# Patient Record
Sex: Male | Born: 1950 | Race: Black or African American | Hispanic: No | Marital: Married | Smoking: Former smoker
Health system: Southern US, Community
[De-identification: ages and names within clinical notes are randomized; demographics above are authoritative.]

## PROBLEM LIST (undated history)

## (undated) DIAGNOSIS — E119 Type 2 diabetes mellitus without complications: Secondary | ICD-10-CM

## (undated) DIAGNOSIS — E7800 Pure hypercholesterolemia, unspecified: Secondary | ICD-10-CM

## (undated) HISTORY — PX: APPENDECTOMY: SHX54

---

## 2019-05-18 ENCOUNTER — Encounter (HOSPITAL_COMMUNITY): Payer: Self-pay

## 2019-05-18 ENCOUNTER — Emergency Department (HOSPITAL_COMMUNITY): Payer: Medicare Other

## 2019-05-18 ENCOUNTER — Other Ambulatory Visit: Payer: Self-pay

## 2019-05-18 ENCOUNTER — Emergency Department (HOSPITAL_COMMUNITY)
Admission: EM | Admit: 2019-05-18 | Discharge: 2019-05-18 | Disposition: A | Discharge status: Home / Self Care | Payer: Medicare Other | Attending: Emergency Medicine | Admitting: Emergency Medicine

## 2019-05-18 DIAGNOSIS — R079 Chest pain, unspecified: Secondary | ICD-10-CM | POA: Diagnosis present

## 2019-05-18 DIAGNOSIS — E119 Type 2 diabetes mellitus without complications: Secondary | ICD-10-CM | POA: Diagnosis not present

## 2019-05-18 DIAGNOSIS — Z87891 Personal history of nicotine dependence: Secondary | ICD-10-CM | POA: Diagnosis not present

## 2019-05-18 DIAGNOSIS — K2100 Gastro-esophageal reflux disease with esophagitis, without bleeding: Secondary | ICD-10-CM | POA: Insufficient documentation

## 2019-05-18 HISTORY — DX: Type 2 diabetes mellitus without complications: E11.9

## 2019-05-18 HISTORY — DX: Pure hypercholesterolemia, unspecified: E78.00

## 2019-05-18 LAB — BASIC METABOLIC PANEL
Anion gap: 9 (ref 5–15)
BUN: 9 mg/dL (ref 8–23)
CO2: 24 mmol/L (ref 22–32)
Calcium: 9.3 mg/dL (ref 8.9–10.3)
Chloride: 103 mmol/L (ref 98–111)
Creatinine, Ser: 1.02 mg/dL (ref 0.61–1.24)
GFR calc Af Amer: >60 mL/min (ref >60)
GFR calc non Af Amer: >60 mL/min (ref >60)
Glucose, Bld: 94 mg/dL (ref 70–99)
Potassium: 3.9 mmol/L (ref 3.5–5.1)
Sodium: 136 mmol/L (ref 135–145)

## 2019-05-18 LAB — CBC
HCT: 38.2 % — ABNORMAL LOW (ref 39.0–52.0)
Hemoglobin: 13 g/dL (ref 13.0–17.0)
MCH: 30.8 pg (ref 26.0–34.0)
MCHC: 34 g/dL (ref 30.0–36.0)
MCV: 90.5 fL (ref 80.0–100.0)
Platelets: 268 10*3/uL (ref 150–400)
RBC: 4.22 MIL/uL (ref 4.22–5.81)
RDW: 12.9 % (ref 11.5–15.5)
WBC: 5.1 10*3/uL (ref 4.0–10.5)
nRBC: 0 % (ref 0.0–0.2)

## 2019-05-18 LAB — TROPONIN I (HIGH SENSITIVITY): Troponin I (High Sensitivity): 4 ng/L (ref <18)

## 2019-05-18 MED ORDER — SODIUM CHLORIDE 0.9% FLUSH: 3.0000 mL | Freq: Once | INTRAVENOUS | Status: DC

## 2019-05-18 NOTE — ED Triage Notes (Signed)
Pt presents w/Left side chest discomfort starting last night

## 2019-05-18 NOTE — ED Notes (Signed)
Pt states he is going to leave and go see his primary MD. RN advised against leaving.

## 2019-05-18 NOTE — ED Provider Notes (Signed)
[facility] DEPART[facility] Provider Note   CSN: [id number] Arrival date & time: 05/18/19  1003     History   Chief Complaint Chief Complaint  Patient presents with  . Chest Pain    HPI Jeffrey Sparks is a 68 y.o. male.     HPI   Patient presenting for evaluation of chest discomfort with known history of reflux esophagitis, and diabetes.  He is here for evaluation of episode of chest discomfort occurred last night when he was "slouching on the couch."  He describes having sharp pressure-like discomfort in the left anterior chest that lasted about an hour and a half improved after he took Pepcid and drink some water and belched.  He has had episodes of heartburn previously, and 2 months ago had upper endoscopy which showed erosive esophagitis, at which time he was started on pantoprazole.  No prior cardiac history.  He denies shortness of breath, recurrent chest discomfort, headache, neck pain or back pain.  He is taking his usual medications.  There are no other known modifying factors.  Past Medical History:  Diagnosis Date  . Diabetes mellitus without complication ([facility])   . High cholesterol     There are no active problems to display for this patient.   Past Surgical History:  Procedure Laterality Date  . APPENDECTOMY          Home Medications    Prior to Admission medications   Not on File    Family History History reviewed. No pertinent family history.  Social History Social History   Tobacco Use  . Smoking status: Former Research scientist (life sciences)  . Smokeless tobacco: Never Used  Substance Use Topics  . Alcohol use: Yes    Comment: social   . Drug use: Never     Allergies   Patient has no known allergies.   Review of Systems Review of Systems  All other systems reviewed and are negative.    Physical Exam Updated Vital Signs BP 117/80   Pulse 67   Temp 98.6 F (37 C) (Oral)   Resp 13   Ht 5\' 9"  (1.753 m)   Wt 88.5 kg   SpO2 100%    BMI 28.80 kg/m   Physical Exam Vitals signs and nursing note reviewed.  Constitutional:      Appearance: He is well-developed.  HENT:     Head: Normocephalic and atraumatic.     Right Ear: External ear normal.     Left Ear: External ear normal.  Eyes:     Conjunctiva/sclera: Conjunctivae normal.     Pupils: Pupils are equal, round, and reactive to light.  Neck:     Musculoskeletal: Normal range of motion and neck supple.     Trachea: Phonation normal.  Cardiovascular:     Rate and Rhythm: Normal rate and regular rhythm.     Heart sounds: Normal heart sounds. No murmur. No friction rub.  Pulmonary:     Effort: Pulmonary effort is normal.     Breath sounds: Normal breath sounds.  Abdominal:     Palpations: Abdomen is soft.     Tenderness: There is no abdominal tenderness.  Musculoskeletal: Normal range of motion.        General: No swelling or tenderness.     Comments:    Skin:    General: Skin is warm and dry.  Neurological:     Mental Status: He is alert and oriented to person, place, and time.     Cranial  Nerves: No cranial nerve deficit.     Sensory: No sensory deficit.     Motor: No abnormal muscle tone.     Coordination: Coordination normal.  Psychiatric:        Mood and Affect: Mood normal.        Behavior: Behavior normal.        Thought Content: Thought content normal.        Judgment: Judgment normal.      ED Treatments / Results  Labs (all labs ordered are listed, but only abnormal results are displayed) Labs Reviewed  CBC - Abnormal; Notable for the following components:      Result Value   HCT 38.2 (*)    All other components within normal limits  BASIC METABOLIC PANEL  TROPONIN I (HIGH SENSITIVITY)  TROPONIN I (HIGH SENSITIVITY)    EKG EKG Interpretation  Date/Time:  Tuesday May 18 2019 10:40:55 EST Ventricular Rate:  66 PR Interval:  176 QRS Duration: 84 QT Interval:  392 QTC Calculation: 410 R Axis:   83 Text Interpretation:  Normal sinus rhythm Normal ECG No old tracing to compare Confirmed by Mancel BaleWentz, Kanesha Cadle [phone]([id number]) on 05/18/2019 4:34:07 PM   Radiology Dg Chest 2 View  Result Date: 05/18/2019 CLINICAL DATA:  Onset left chest discomfort last night. No known injury. EXAM: CHEST - 2 VIEW COMPARISON:  None. FINDINGS: The lungs are clear. Heart size is normal. No pneumothorax or pleural fluid. No acute or focal bony abnormality. IMPRESSION: Negative chest. Electronically Signed   By: Drusilla Kannerhomas  Dalessio M.D.   On: 05/18/2019 11:02    Procedures Procedures (including critical care time)  Medications Ordered in ED Medications  sodium chloride flush (NS) 0.9 % injection 3 mL (3 mLs Intravenous Not Given 05/18/19 1628)     Initial Impression / Assessment and Plan / ED Course  I have reviewed the triage vital signs and the nursing notes.  Pertinent labs & imaging results that were available during my care of the patient were reviewed by me and considered in my medical decision making (see chart for details).  Clinical Course as of May 17 1714  Tue May 18, 2019  1636 Troponin I (High Sensitivity) [EW]  [phone] Normal  Troponin I (High Sensitivity) [EW]  1636 Normal  CBC(!) [EW]  1636 Normal  Basic metabolic panel [EW]  1643 No infiltrate or CHF, images interpreted by me  DG Chest 2 View [EW]  1708 Normal  Troponin I (High Sensitivity) [EW]    Clinical Course User Index [EW] Mancel BaleWentz, Oleg Oleson, MD        Patient Vitals for the past 24 hrs:  BP Temp Temp src Pulse Resp SpO2 Height Weight  05/18/19 1700 117/80 - - 67 13 100 % - -  05/18/19 1645 136/77 - - 62 17 99 % - -  05/18/19 1630 (!) 143/79 - - 65 16 100 % - -  05/18/19 1627 - - - - - - 5\' 9"  (1.753 m) 88.5 kg  05/18/19 1625 (!) 133/92 - - (!) 57 18 100 % - -  05/18/19 1036 (!) 126/96 98.6 F (37 C) Oral 61 18 98 % - -    4:39 PM Reevaluation with update and discussion. After initial assessment and treatment, an updated evaluation reveals he is  comfortable has no further complaints, findings discussed and questions answered. Mancel BaleElliott Alaine Loughney   Medical Decision Making: Chest pain most likely related to reflux disease with known esophagitis.  Doubt ACS, PE or pneumonia.  No additional evaluation required.  Stable for discharge.  CRITICAL CARE-no Performed by: Mancel Bale  Nursing Notes Reviewed/ Care Coordinated Applicable Imaging Reviewed Interpretation of Laboratory Data incorporated into ED treatment  The patient appears reasonably screened and/or stabilized for discharge and I doubt any other medical condition or other [facility] requiring further screening, evaluation, or treatment in the ED at this time prior to discharge.  Plan: Home Medications-continue usual, supplement with antacids as needed; Home Treatments-GERD diet; return here if the recommended treatment, does not improve the symptoms; Recommended follow up-PCP and GI.   Final Clinical Impressions(s) / ED Diagnoses   Final diagnoses:  Nonspecific chest pain  Gastroesophageal reflux disease with esophagitis, unspecified whether hemorrhage    ED Discharge Orders    None       Mancel Bale, MD 05/18/19 1715

## 2019-05-18 NOTE — Discharge Instructions (Addendum)
Testing today did not show any serious problems with your heart, or lungs.  Most likely, your pain was related to reflux, aggravating your esophagitis.  Treatment for additional episodes like this include antacids such as Mylanta or Tums.  Continue taking your pantoprazole and follow the diet recommended by your gastroenterologist.  Make sure you are getting plenty of rest, and avoid slouching which can aggravate reflux.  Return here if needed for problems.

## 2020-01-01 IMAGING — CR DG CHEST 2V
2 series · 2 of 2 positions shown · non-contrast
Comparison: None.

CLINICAL DATA: Onset left chest discomfort last night. No known
injury.

EXAM:
CHEST - 2 VIEW

[chest pa]
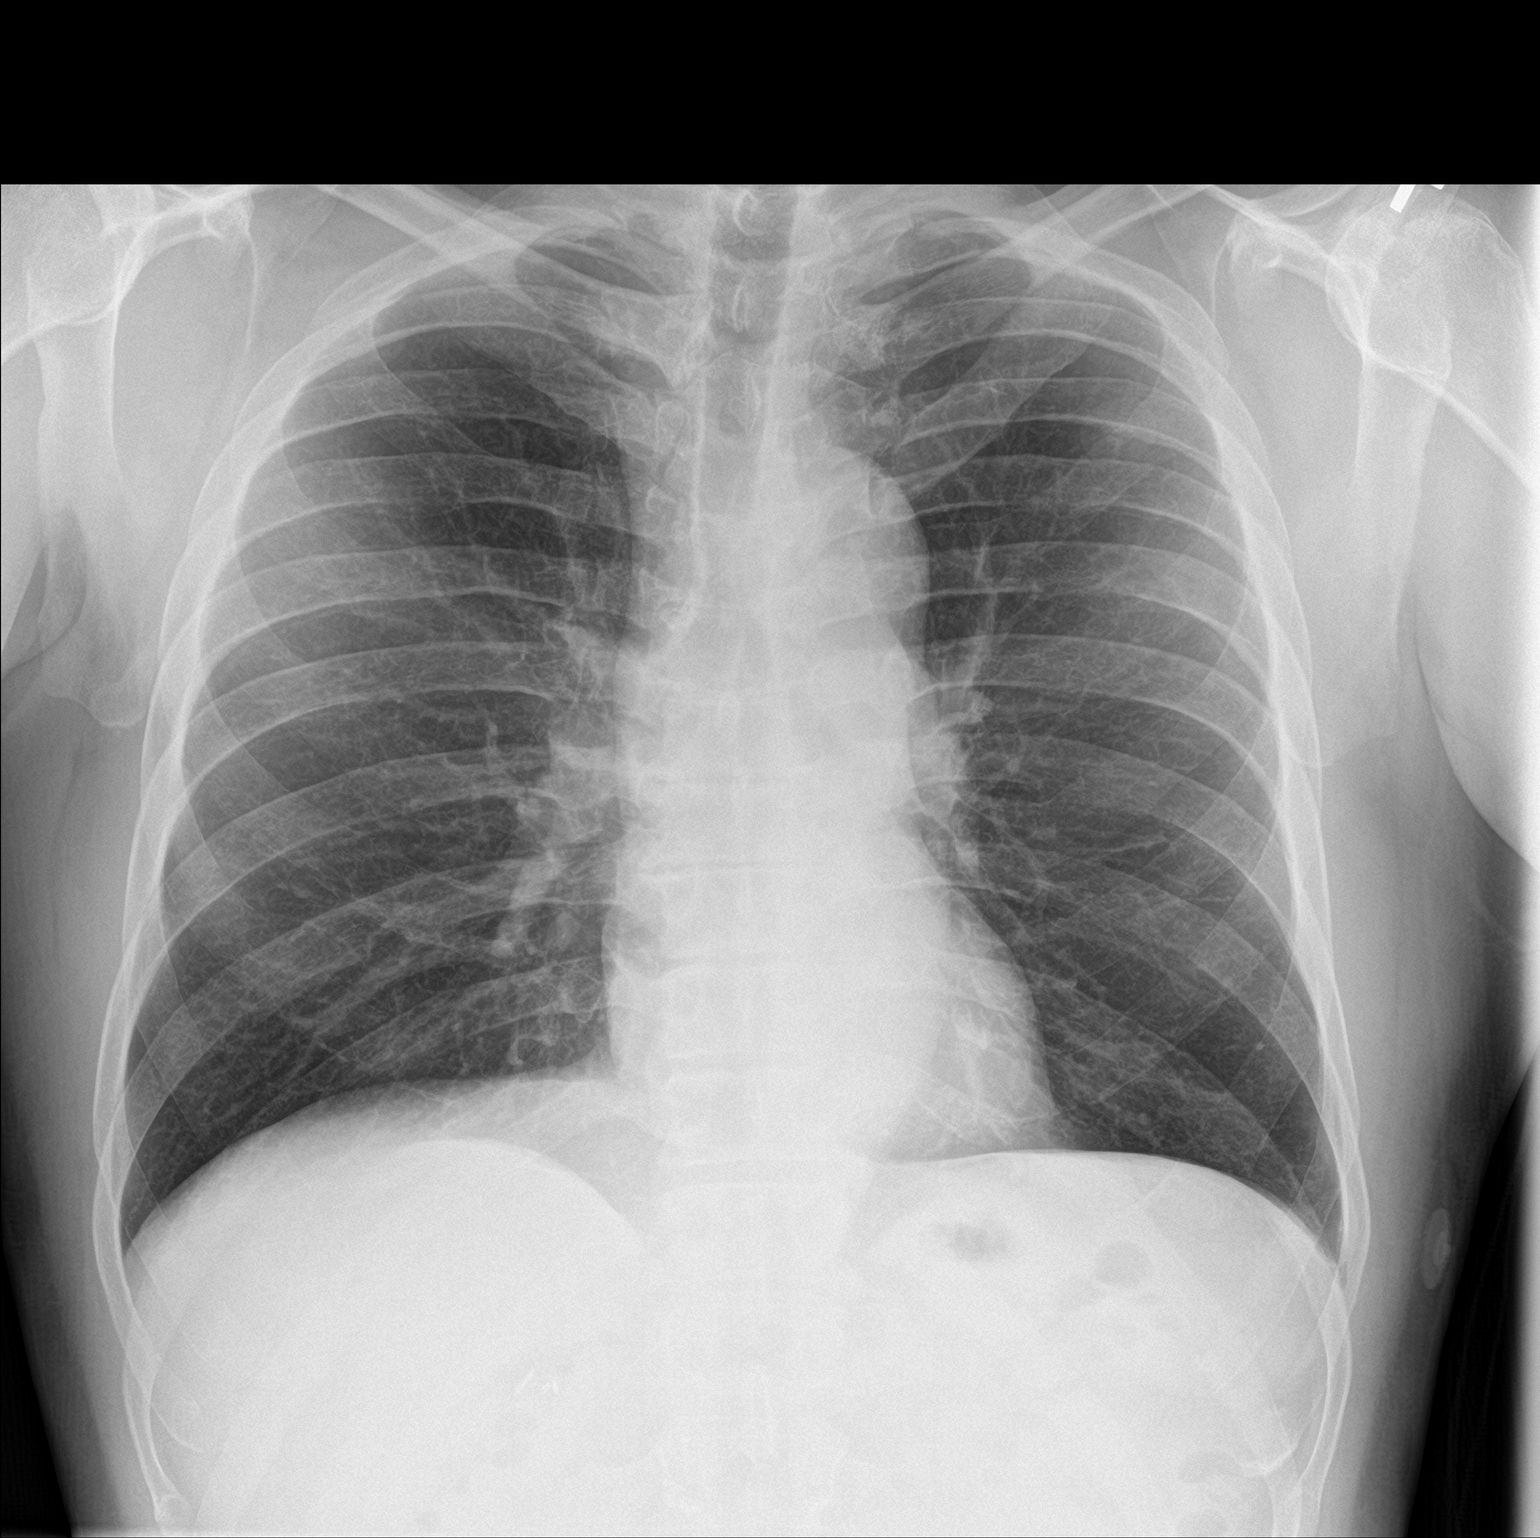

[chest lat]
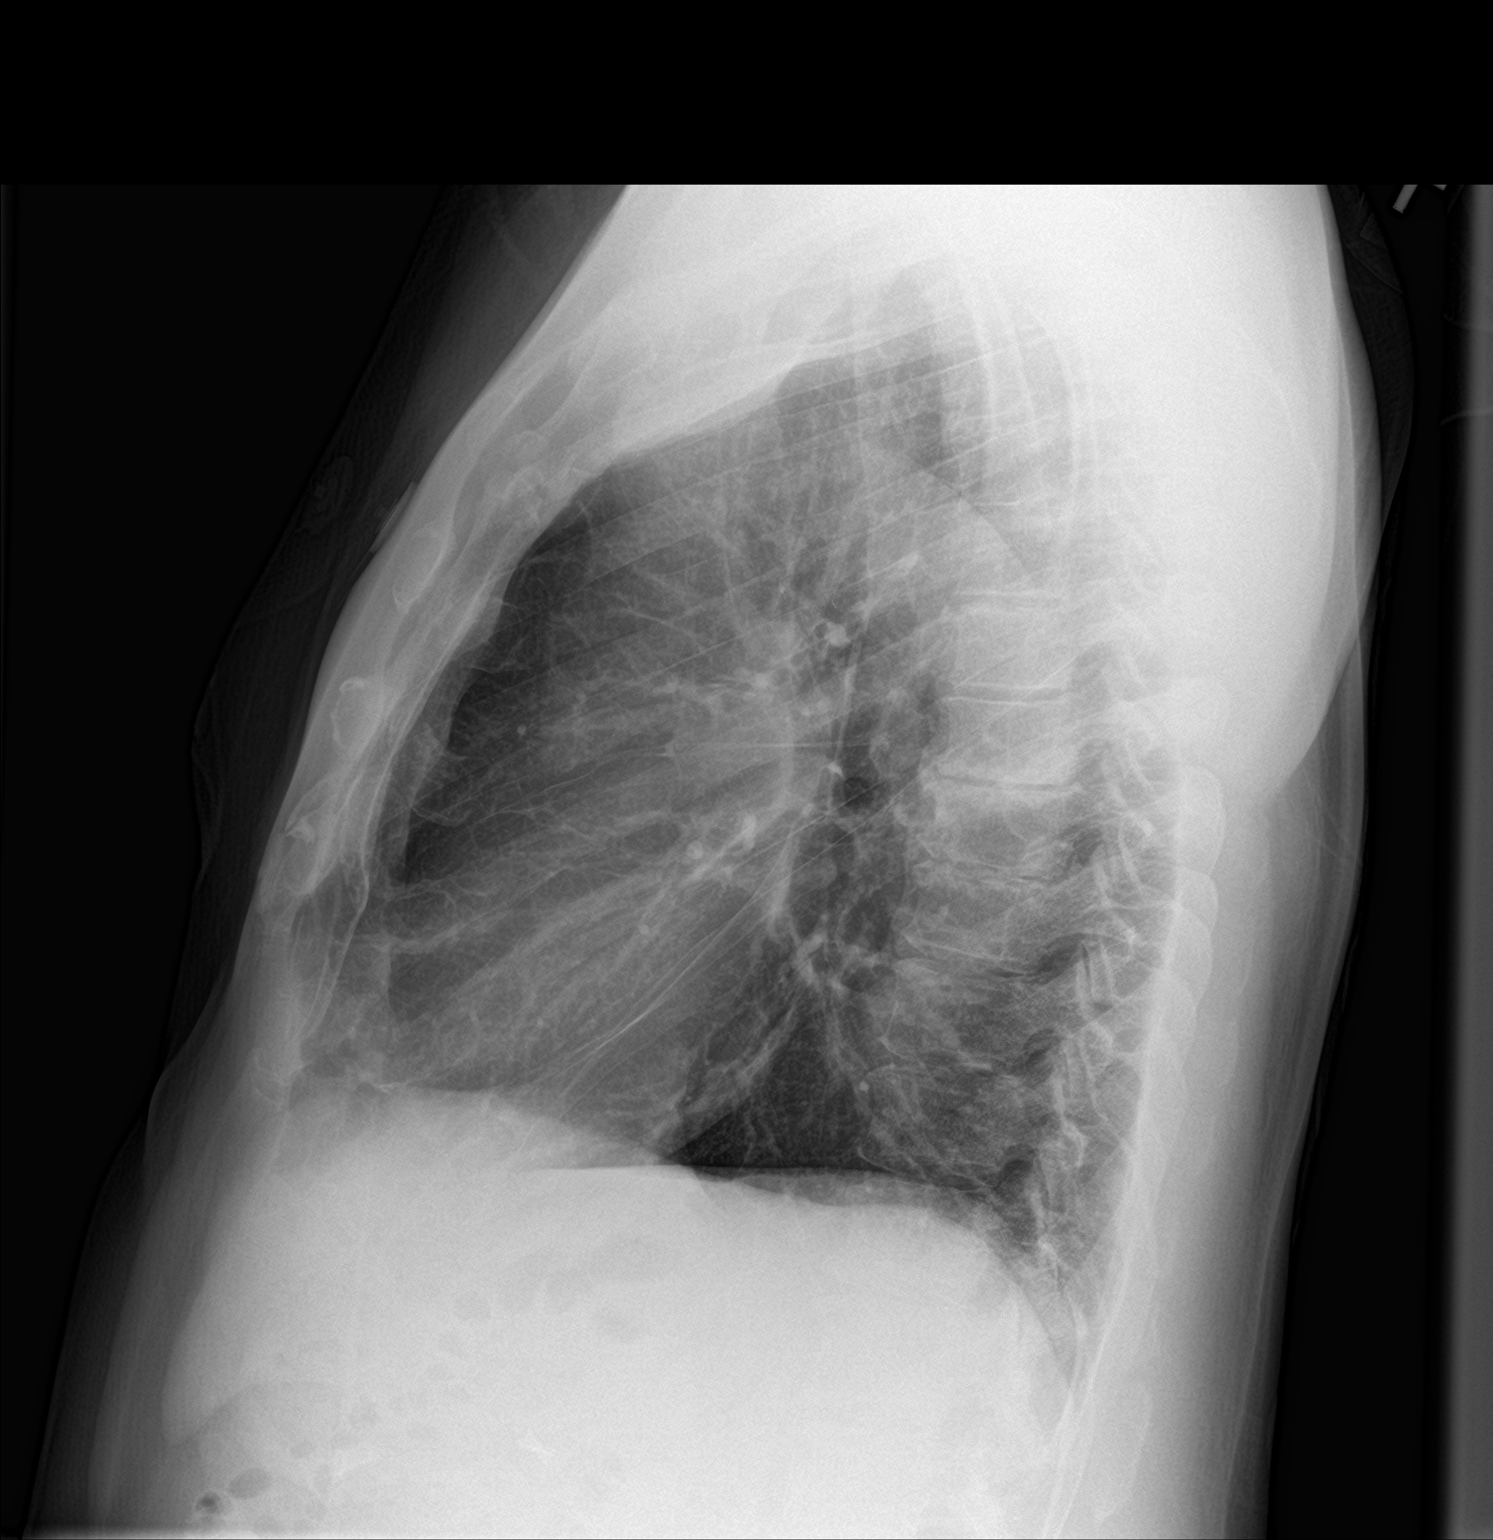

[2 of 2 positions shown; findings below may reference images not displayed]

FINDINGS: The lungs are clear. Heart size is normal. No pneumothorax or
pleural fluid. No acute or focal bony abnormality.
IMPRESSION: Negative chest.

## 2022-04-17 ENCOUNTER — Ambulatory Visit: Payer: PRIVATE HEALTH INSURANCE | Admitting: Radiation Oncology

## 2023-08-20 ENCOUNTER — Other Ambulatory Visit: Payer: Self-pay | Admitting: Specialist

## 2023-08-20 DIAGNOSIS — J13 Pneumonia due to Streptococcus pneumoniae: Secondary | ICD-10-CM

## 2023-08-25 ENCOUNTER — Other Ambulatory Visit: Payer: PRIVATE HEALTH INSURANCE

## 2023-08-25 ENCOUNTER — Ambulatory Visit: Admission: RE | Admit: 2023-08-25 | Payer: Self-pay | Source: Ambulatory Visit
